# Patient Record
Sex: Male | Born: 1970 | Race: White | Hispanic: No | Marital: Single | Smoking: Never smoker
Health system: Southern US, Community
[De-identification: ages and names within clinical notes are randomized; demographics above are authoritative.]

## PROBLEM LIST (undated history)

## (undated) HISTORY — PX: HEMORRHOID SURGERY: SHX153

---

## 2000-08-05 ENCOUNTER — Encounter: Payer: Self-pay | Admitting: Family Medicine

## 2000-08-05 ENCOUNTER — Ambulatory Visit (HOSPITAL_COMMUNITY): Admission: RE | Admit: 2000-08-05 | Discharge: 2000-08-05 | Payer: Self-pay | Admitting: Family Medicine

## 2000-11-25 ENCOUNTER — Ambulatory Visit (HOSPITAL_COMMUNITY): Admission: RE | Admit: 2000-11-25 | Discharge: 2000-11-25 | Payer: Self-pay | Admitting: Gastroenterology

## 2010-08-29 DIAGNOSIS — E079 Disorder of thyroid, unspecified: Secondary | ICD-10-CM

## 2010-08-29 HISTORY — DX: Disorder of thyroid, unspecified: E07.9

## 2015-06-29 DIAGNOSIS — M069 Rheumatoid arthritis, unspecified: Secondary | ICD-10-CM

## 2015-06-29 HISTORY — DX: Rheumatoid arthritis, unspecified: M06.9

## 2019-11-08 ENCOUNTER — Other Ambulatory Visit: Payer: Self-pay | Admitting: Family Medicine

## 2019-11-08 DIAGNOSIS — E049 Nontoxic goiter, unspecified: Secondary | ICD-10-CM

## 2019-11-15 ENCOUNTER — Ambulatory Visit
Admission: RE | Admit: 2019-11-15 | Discharge: 2019-11-15 | Disposition: A | Discharge status: Home / Self Care | Payer: BC Managed Care – PPO | Source: Ambulatory Visit | Attending: Family Medicine | Admitting: Family Medicine

## 2019-11-15 DIAGNOSIS — E049 Nontoxic goiter, unspecified: Secondary | ICD-10-CM

## 2019-11-17 ENCOUNTER — Other Ambulatory Visit: Payer: Self-pay | Admitting: Family Medicine

## 2019-11-17 DIAGNOSIS — E041 Nontoxic single thyroid nodule: Secondary | ICD-10-CM

## 2019-12-01 ENCOUNTER — Ambulatory Visit
Admission: RE | Admit: 2019-12-01 | Discharge: 2019-12-01 | Disposition: A | Discharge status: Home / Self Care | Payer: BC Managed Care – PPO | Source: Ambulatory Visit | Attending: Family Medicine | Admitting: Family Medicine

## 2019-12-01 ENCOUNTER — Other Ambulatory Visit (HOSPITAL_COMMUNITY)
Admission: RE | Admit: 2019-12-01 | Discharge: 2019-12-01 | Disposition: A | Discharge status: Home / Self Care | Payer: BC Managed Care – PPO | Source: Ambulatory Visit | Attending: Radiology | Admitting: Radiology

## 2019-12-01 DIAGNOSIS — E041 Nontoxic single thyroid nodule: Secondary | ICD-10-CM | POA: Insufficient documentation

## 2019-12-02 LAB — CYTOLOGY - NON PAP

## 2019-12-15 ENCOUNTER — Encounter (HOSPITAL_COMMUNITY): Payer: Self-pay

## 2021-07-27 DIAGNOSIS — R079 Chest pain, unspecified: Secondary | ICD-10-CM | POA: Insufficient documentation

## 2021-08-23 ENCOUNTER — Other Ambulatory Visit: Payer: Self-pay | Admitting: Family Medicine

## 2021-08-24 LAB — CBC WITH DIFFERENTIAL/PLATELET
Basophils Absolute: 0 10*3/uL (ref 0.0–0.2)
Basos: 0 %
EOS (ABSOLUTE): 0.1 10*3/uL (ref 0.0–0.4)
Eos: 1 %
Hematocrit: 40.1 % (ref 37.5–51.0)
Hemoglobin: 12.9 g/dL — ABNORMAL LOW (ref 13.0–17.7)
Immature Grans (Abs): 0 10*3/uL (ref 0.0–0.1)
Immature Granulocytes: 0 %
Lymphocytes Absolute: 1.8 10*3/uL (ref 0.7–3.1)
Lymphs: 26 %
MCH: 29.1 pg (ref 26.6–33.0)
MCHC: 32.2 g/dL (ref 31.5–35.7)
MCV: 90 fL (ref 79–97)
Monocytes Absolute: 0.5 10*3/uL (ref 0.1–0.9)
Monocytes: 8 %
Neutrophils Absolute: 4.3 10*3/uL (ref 1.4–7.0)
Neutrophils: 65 %
Platelets: 219 10*3/uL (ref 150–450)
RBC: 4.44 x10E6/uL (ref 4.14–5.80)
RDW: 14 % (ref 11.6–15.4)
WBC: 6.7 10*3/uL (ref 3.4–10.8)

## 2021-08-24 LAB — COMPREHENSIVE METABOLIC PANEL
ALT: 14 IU/L (ref 0–44)
AST: 12 IU/L (ref 0–40)
Albumin/Globulin Ratio: 2 (ref 1.2–2.2)
Albumin: 4.5 g/dL (ref 3.8–4.9)
Alkaline Phosphatase: 85 IU/L (ref 44–121)
BUN/Creatinine Ratio: 11 (ref 9–20)
BUN: 11 mg/dL (ref 6–24)
Bilirubin Total: 0.5 mg/dL (ref 0.0–1.2)
CO2: 24 mmol/L (ref 20–29)
Calcium: 9.1 mg/dL (ref 8.7–10.2)
Chloride: 108 mmol/L — ABNORMAL HIGH (ref 96–106)
Creatinine, Ser: 0.96 mg/dL (ref 0.76–1.27)
Globulin, Total: 2.3 g/dL (ref 1.5–4.5)
Glucose: 118 mg/dL — ABNORMAL HIGH (ref 70–99)
Potassium: 4.3 mmol/L (ref 3.5–5.2)
Sodium: 146 mmol/L — ABNORMAL HIGH (ref 134–144)
Total Protein: 6.8 g/dL (ref 6.0–8.5)
eGFR: 96 mL/min/{1.73_m2} (ref >59)

## 2021-08-24 LAB — RPR QUALITATIVE: RPR Ser Ql: NONREACTIVE

## 2021-08-24 LAB — LIPID PANEL W/O CHOL/HDL RATIO
Cholesterol, Total: 168 mg/dL (ref 100–199)
HDL: 40 mg/dL (ref >39)
LDL Chol Calc (NIH): 109 mg/dL — ABNORMAL HIGH (ref 0–99)
Triglycerides: 105 mg/dL (ref 0–149)
VLDL Cholesterol Cal: 19 mg/dL (ref 5–40)

## 2021-08-24 LAB — RHEUMATOID FACTOR: Rheumatoid fact SerPl-aCnc: <10 IU/mL (ref <14.0)

## 2021-08-24 LAB — PSA: Prostate Specific Ag, Serum: 3.8 ng/mL (ref 0.0–4.0)

## 2021-08-24 LAB — HGB A1C W/O EAG: Hgb A1c MFr Bld: 6 % — ABNORMAL HIGH (ref 4.8–5.6)

## 2021-08-24 LAB — TSH: TSH: 0.847 u[IU]/mL (ref 0.450–4.500)

## 2021-08-24 LAB — T4, FREE: Free T4: 0.95 ng/dL (ref 0.82–1.77)

## 2021-08-31 ENCOUNTER — Encounter: Payer: Self-pay | Admitting: Internal Medicine

## 2021-08-31 ENCOUNTER — Other Ambulatory Visit: Payer: Self-pay

## 2021-08-31 ENCOUNTER — Ambulatory Visit: Payer: Self-pay | Admitting: Internal Medicine

## 2021-08-31 VITALS — BP 147/80 | HR 70 | Temp 97.6°F | Ht 69.0 in | Wt 168.0 lb

## 2021-08-31 DIAGNOSIS — K582 Mixed irritable bowel syndrome: Secondary | ICD-10-CM

## 2021-08-31 DIAGNOSIS — K589 Irritable bowel syndrome without diarrhea: Secondary | ICD-10-CM | POA: Insufficient documentation

## 2021-08-31 DIAGNOSIS — K219 Gastro-esophageal reflux disease without esophagitis: Secondary | ICD-10-CM

## 2021-08-31 DIAGNOSIS — R7303 Prediabetes: Secondary | ICD-10-CM

## 2021-08-31 MED ORDER — HYOSCYAMINE SULFATE 0.125 MG PO TABS: 0.1250 mg | ORAL_TABLET | Freq: Four times a day (QID) | ORAL | Status: DC | PRN

## 2021-08-31 NOTE — Progress Notes (Signed)
? ?New Patient Office Visit ? ?Subjective:  ?Patient ID: Brandon Petty, male    DOB: 11/22/1970  Age: 51 y.o. MRN: [id number] ? ?CC:  ?Chief Complaint  ?Patient presents with  ? Chest Pain  ?  Bottom left side of his chest. Radiating pain to his middle back. Some relief when he lies down  ? ? ?HPI ?Brandon Petty presents for episodes of left chest pain associated with meals that radiates to his back. Pain was 8-9/10 severity and would last for 30 minutes with spontaneous relief unrelated to exertion. No associated SOB or palpitations. Admits to h/o abddominal bloating and diarrhea. Last episode was 4 days ago no known relieving factors. Also reports exacerbation of abd symptoms with wheat products. ? ?Past Medical History:  ?Diagnosis Date  ? Rheumatoid arthritis (HCC) 06/29/2015  ? Thyroid condition 08/29/2010  ? Not entirley sure  ?Anal fissure ? ?Past Surgical History:  ?Procedure Laterality Date  ? HEMORRHOID SURGERY    ? ? ?Family History  ?Problem Relation Age of Onset  ? Hyperlipidemia Mother   ? Heart failure Father   ? ? ?Social History  ? ?Socioeconomic History  ? Marital status: Single  ?  Spouse name: Not on file  ? Number of children: Not on file  ? Years of education: Not on file  ? Highest education level: Doctorate  ?Occupational History  ? Not on file  ?Tobacco Use  ? Smoking status: Never  ? Smokeless tobacco: Never  ?Vaping Use  ? Vaping Use: Not on file  ?Substance and Sexual Activity  ? Alcohol use: Never  ? Drug use: Not Currently  ? Sexual activity: Not Currently  ?Other Topics Concern  ? Not on file  ?Social History Narrative  ? Not on file  ? ?Social Determinants of Health  ? ?Financial Resource Strain: Not on file  ?Food Insecurity: Not on file  ?Transportation Needs: Not on file  ?Physical Activity: Not on file  ?Stress: Not on file  ?Social Connections: Not on file  ?Intimate Partner Violence: Not on file  ? ? ?ROS ?Review of Systems  ?Constitutional: Negative.   ?HENT: Negative.     ?Respiratory: Negative.    ?Cardiovascular:   ?     As in hpi  ?Gastrointestinal:  Positive for abdominal distention, constipation and diarrhea.  ?Endocrine: Negative.   ?Genitourinary: Negative.   ?Musculoskeletal: Negative.   ?Skin: Negative.   ?Psychiatric/Behavioral: Negative.    ? ?Objective:  ? ?Today'Kris Burd Vitals: BP (!) 147/80 (BP Location: Left Arm, Patient Position: Sitting)   Pulse 70   Temp 97.6 ?F (36.4 ?C)   Ht 5\' 9"  (1.753 m)   Wt 168 lb (76.2 kg)   SpO2 98%   BMI 24.81 kg/m?  ? ?Physical Exam ?Vitals reviewed.  ?Constitutional:   ?   Appearance: He is well-developed.  ?Eyes:  ?   Pupils: Pupils are equal, round, and reactive to light.  ?Neck:  ?   Thyroid: Thyroid mass present.  ?Cardiovascular:  ?   Rate and Rhythm: Normal rate and regular rhythm.  ?   Heart sounds: Normal heart sounds. No murmur heard. ?Pulmonary:  ?   Effort: No respiratory distress.  ?   Breath sounds: No rhonchi or rales.  ?Abdominal:  ?   General: Abdomen is flat.  ?   Tenderness: There is abdominal tenderness in the left upper quadrant and left lower quadrant. There is no guarding or rebound.  ?Musculoskeletal:     ?   General:  Normal range of motion.  ?   Cervical back: Neck supple.  ?Skin: ?   General: Skin is warm and dry.  ?Neurological:  ?   General: No focal deficit present.  ?   Mental Status: He is alert and oriented to person, place, and time.  ? ? ?Assessment & Plan:  ?Atypical CP-treat for GERD, if persists refer for cardiac w/u ?IBS-order hyoscyamine and enquire for Viberzi samples ?Problem List Items Addressed This Visit   ? ?  ? Digestive  ? IBS (irritable bowel syndrome)  ?  ? Other  ? Pre-diabetes  ? ? ?No outpatient encounter medications on file as of 08/31/2021.  ? ?No facility-administered encounter medications on file as of 08/31/2021.  ? ? ?Follow-up: No follow-ups on file.  ? ?Luna Fuse, MD ? ?

## 2021-09-28 ENCOUNTER — Ambulatory Visit: Payer: Self-pay | Admitting: Cardiovascular Disease

## 2021-09-28 ENCOUNTER — Encounter: Payer: Self-pay | Admitting: Cardiovascular Disease

## 2021-09-28 VITALS — BP 134/73 | HR 73 | Temp 98.2°F | Ht 69.0 in | Wt 172.2 lb

## 2021-09-28 DIAGNOSIS — K582 Mixed irritable bowel syndrome: Secondary | ICD-10-CM

## 2021-09-28 MED ORDER — HYOSCYAMINE SULFATE 0.125 MG PO TABS: 0.1250 mg | ORAL_TABLET | ORAL | 3 refills | Status: DC | PRN

## 2021-09-28 NOTE — Progress Notes (Signed)
? ?Established Patient Office Visit ? ?Subjective:  ?Patient ID: Brandon Petty, male    DOB: 11-26-1970  Age: 51 y.o. MRN: [id number] ? ?CC:  ?Chief Complaint  ?Patient presents with  ? Follow-up  ?  Follow-up for meds and chest pain  ? ? ?HPI ?Brandon Petty presents for a follow-up visit regarding chest pain suspected to be due to GERD and IBS.  He was seen last month for chest pain, abdominal bloating and diarrhea.  He was given hyoscyamine and omeprazole.  He was only able to get omeprazole and took it for 1 month with only slight improvement in symptoms.  It gave him diarrhea.  He has not started taking histamine.  He has no exertional chest pain.  All his symptoms seem to be triggered by certain foods and with hunger.  This has been a chronic problem for him for more than 20 years. ? ?I reviewed his EKG from last month which was normal.  In addition, he complains of left hip and back discomfort. ? ?Past Medical History:  ?Diagnosis Date  ? Rheumatoid arthritis ([place]) 06/29/2015  ? Thyroid condition 08/29/2010  ? Not entirley sure  ? ? ?Past Surgical History:  ?Procedure Laterality Date  ? HEMORRHOID SURGERY    ? ? ?Family History  ?Problem Relation Age of Onset  ? Hyperlipidemia Mother   ? Heart failure Father   ? ? ?Social History  ? ?Socioeconomic History  ? Marital status: Single  ?  Spouse name: Not on file  ? Number of children: Not on file  ? Years of education: Not on file  ? Highest education level: Doctorate  ?Occupational History  ? Not on file  ?Tobacco Use  ? Smoking status: Never  ? Smokeless tobacco: Never  ?Vaping Use  ? Vaping Use: Not on file  ?Substance and Sexual Activity  ? Alcohol use: Never  ? Drug use: Not Currently  ? Sexual activity: Not Currently  ?Other Topics Concern  ? Not on file  ?Social History Narrative  ? Not on file  ? ?Social Determinants of Health  ? ?Financial Resource Strain: Not on file  ?Food Insecurity: Not on file  ?Transportation Needs: Not on file  ?Physical  Activity: Not on file  ?Stress: Not on file  ?Social Connections: Not on file  ?Intimate Partner Violence: Not on file  ? ? ?Outpatient Medications Prior to Visit  ?Medication Sig Dispense Refill  ? omeprazole (PRILOSEC) 40 MG capsule Take 40 mg by mouth daily.    ? hyoscyamine (LEVSIN) tablet 0.125 mg     ? ?No facility-administered medications prior to visit.  ? ? ?Not on File ? ?ROS ?Review of Systems ? ?  ?Objective:  ?  ?Physical Exam ? ?BP 134/73   Pulse 73   Temp 98.2 ?F (36.8 ?C) (Oral)   Ht 5' 9"  (1.753 m)   Wt 172 lb 3.2 oz (78.1 kg)   SpO2 97%   BMI 25.43 kg/m?  ?Wt Readings from Last 3 Encounters:  ?09/28/21 172 lb 3.2 oz (78.1 kg)  ?08/31/21 168 lb (76.2 kg)  ? ? ? ?Health Maintenance Due  ?Topic Date Due  ? HIV Screening  Never done  ? Hepatitis C Screening  Never done  ? TETANUS/TDAP  Never done  ? COLONOSCOPY (Pts 45-40yr Insurance coverage will need to be confirmed)  Never done  ? Zoster Vaccines- Shingrix (1 of 2) Never done  ? ? ?There are no preventive care reminders to display for this  patient. ? ?Lab Results  ?Component Value Date  ? TSH 0.847 08/23/2021  ? ?Lab Results  ?Component Value Date  ? WBC 6.7 08/23/2021  ? HGB 12.9 (L) 08/23/2021  ? HCT 40.1 08/23/2021  ? MCV 90 08/23/2021  ? PLT 219 08/23/2021  ? ?Lab Results  ?Component Value Date  ? NA 146 (H) 08/23/2021  ? K 4.3 08/23/2021  ? CO2 24 08/23/2021  ? GLUCOSE 118 (H) 08/23/2021  ? BUN 11 08/23/2021  ? CREATININE 0.96 08/23/2021  ? BILITOT 0.5 08/23/2021  ? ALKPHOS 85 08/23/2021  ? AST 12 08/23/2021  ? ALT 14 08/23/2021  ? PROT 6.8 08/23/2021  ? ALBUMIN 4.5 08/23/2021  ? CALCIUM 9.1 08/23/2021  ? EGFR 96 08/23/2021  ? ?Lab Results  ?Component Value Date  ? CHOL 168 08/23/2021  ? ?Lab Results  ?Component Value Date  ? HDL 40 08/23/2021  ? ?Lab Results  ?Component Value Date  ? LDLCALC 109 (H) 08/23/2021  ? ?Lab Results  ?Component Value Date  ? TRIG 105 08/23/2021  ? ?No results found for: CHOLHDL ?Lab Results  ?Component Value  Date  ? HGBA1C 6.0 (H) 08/23/2021  ? ? ?  ?Assessment & Plan:  ? ?1.  Irritable bowel syndrome: No significant improvement with omeprazole and it seems that he had diarrhea with it.  I re prescribed Hyoscyamine to be used as needed.  A prescription was sent to [facility]. ?Given his age, I will need to be referred for colonoscopy in the near future. ? ?2.  Left hip and back pain: Seems to be a chronic issue.  Schedule with Dr. Donivan Scull. ? ?Meds ordered this encounter  ?Medications  ? hyoscyamine (LEVSIN) 0.125 MG tablet  ?  Sig: Take 1 tablet (0.125 mg total) by mouth every 4 (four) hours as needed.  ?  Dispense:  30 tablet  ?  Refill:  3  ? ? ?Follow-up: in 3 months ? ? ?Kathlyn Sacramento, MD ?

## 2021-11-30 ENCOUNTER — Ambulatory Visit: Payer: Self-pay | Admitting: Orthopedic Surgery

## 2021-11-30 VITALS — BP 138/73 | HR 74 | Temp 98.1°F | Ht 69.5 in | Wt 171.8 lb

## 2021-11-30 DIAGNOSIS — G8929 Other chronic pain: Secondary | ICD-10-CM

## 2021-11-30 NOTE — Progress Notes (Signed)
S: LBP, no radiation O: -ve SLR      Normal neuro exam A; mechanical LBP P: LBP,  Diclofanac sod 75 BID

## 2021-12-28 ENCOUNTER — Ambulatory Visit: Payer: Self-pay | Admitting: Orthopedic Surgery

## 2021-12-28 ENCOUNTER — Ambulatory Visit: Payer: Self-pay | Admitting: Cardiovascular Disease

## 2022-02-16 IMAGING — US US THYROID
1 series · 13 of 25 positions shown · non-contrast
Comparison: None.

CLINICAL DATA: Goiter.

EXAM:
THYROID ULTRASOUND
TECHNIQUE: Ultrasound examination of the thyroid gland and adjacent soft
tissues was performed.

[Series 1: us thyroid · 0.10mm/px · 13 of 45 slices shown]
[im 1/45]
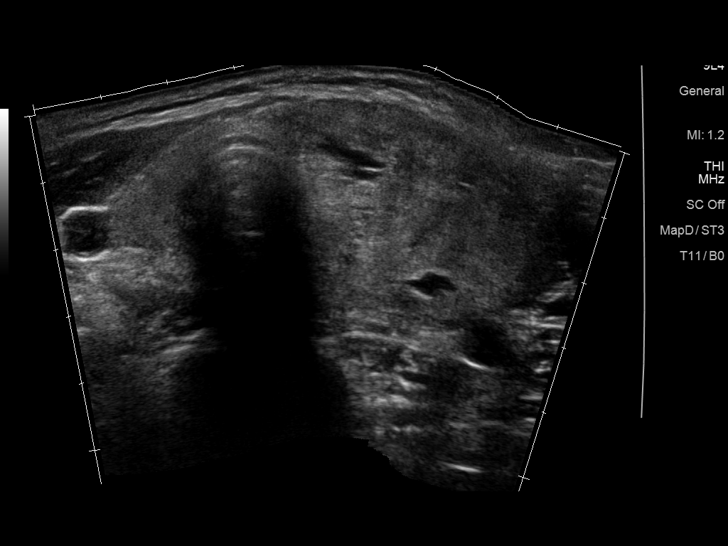
[im 4/45]
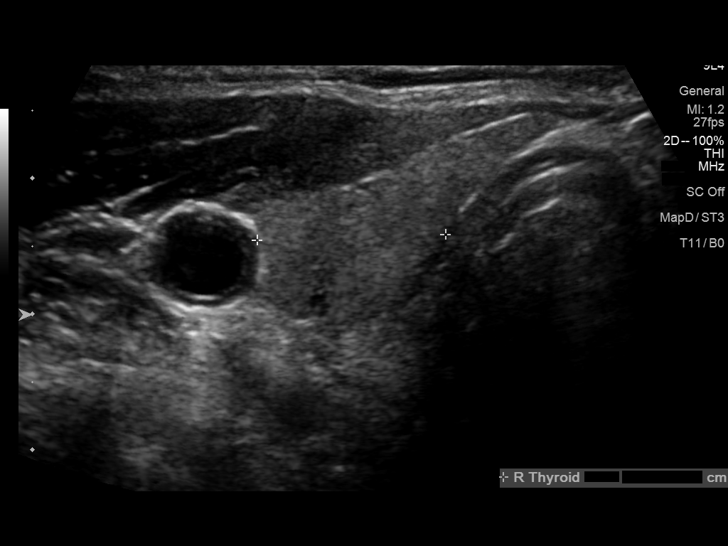
[im 8/45]
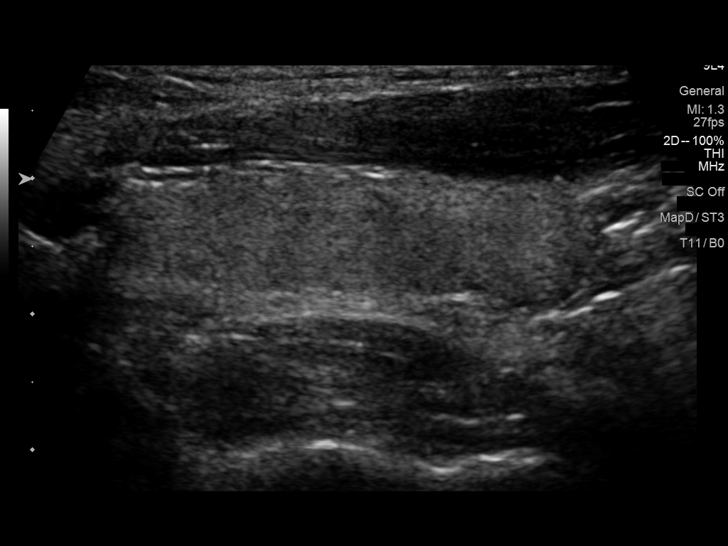
[im 12/45]
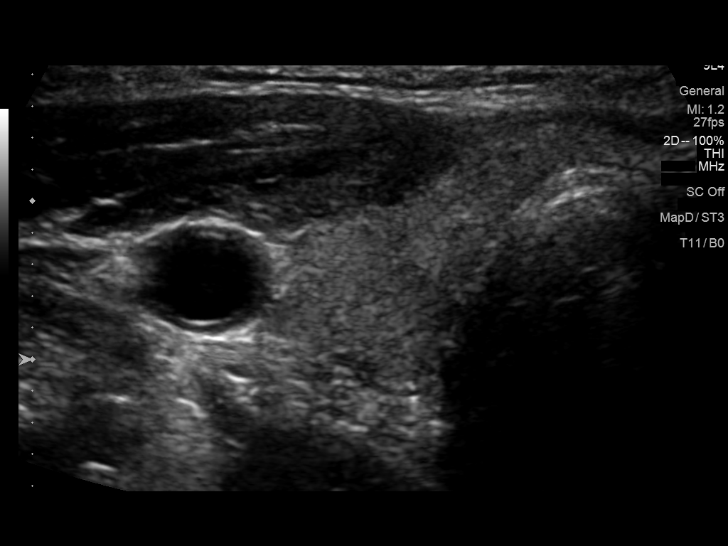
[im 15/45]
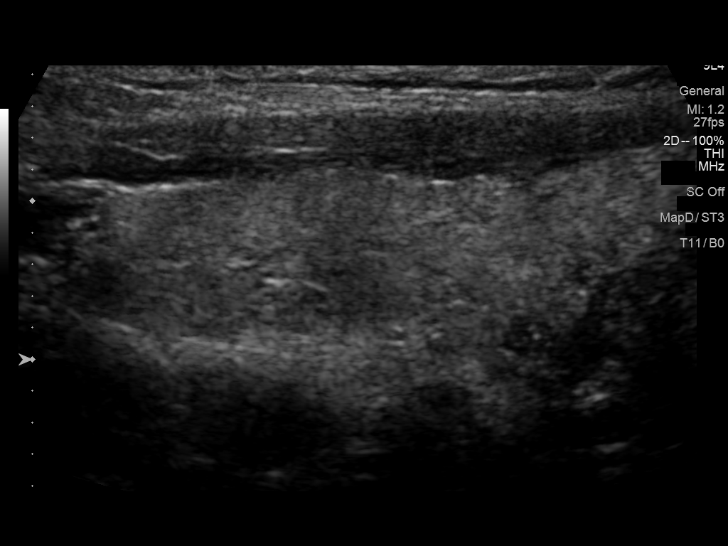
[im 19/45]
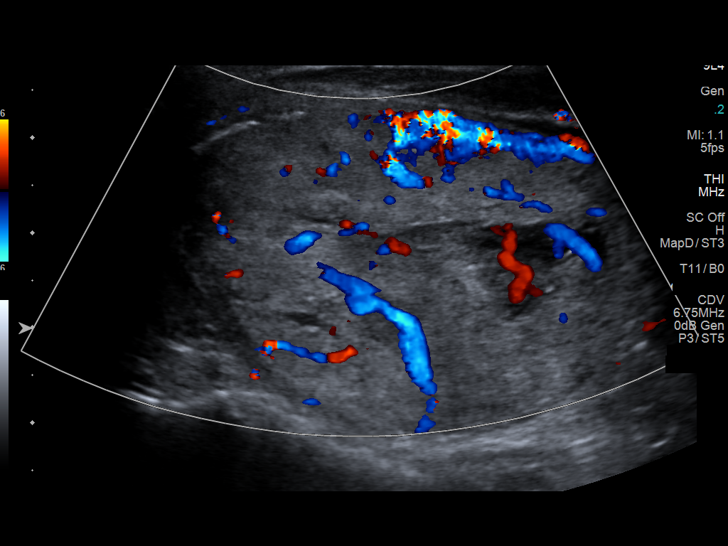
[im 23/45]
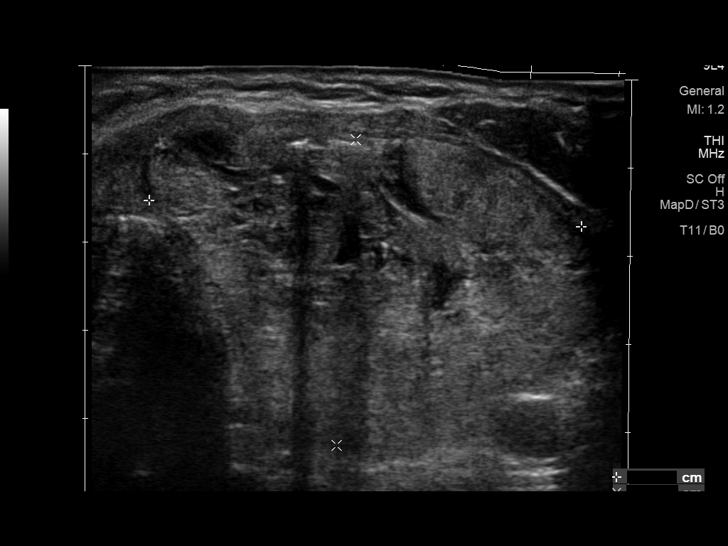
[im 26/45]
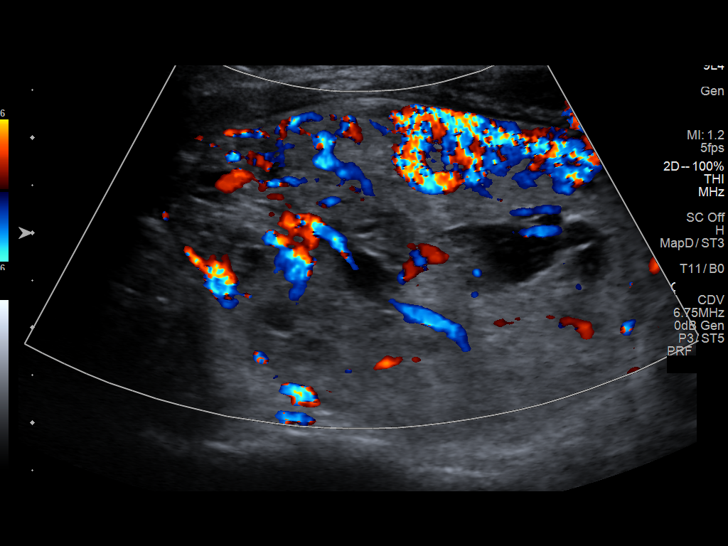
[im 30/45]
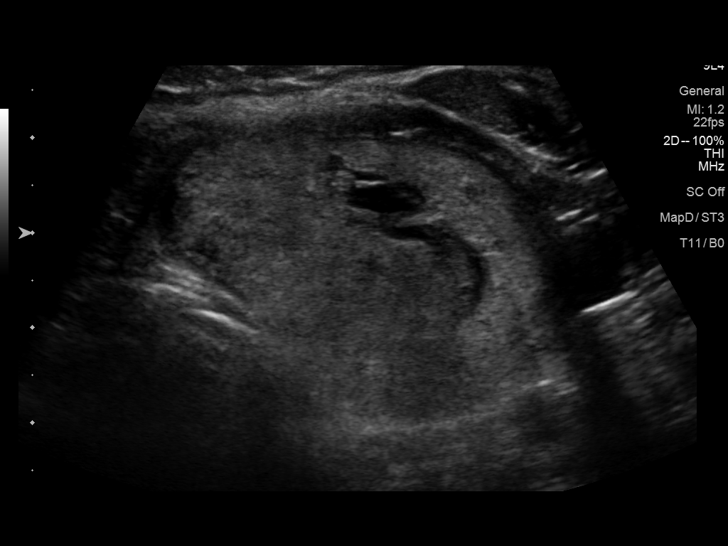
[im 34/45]
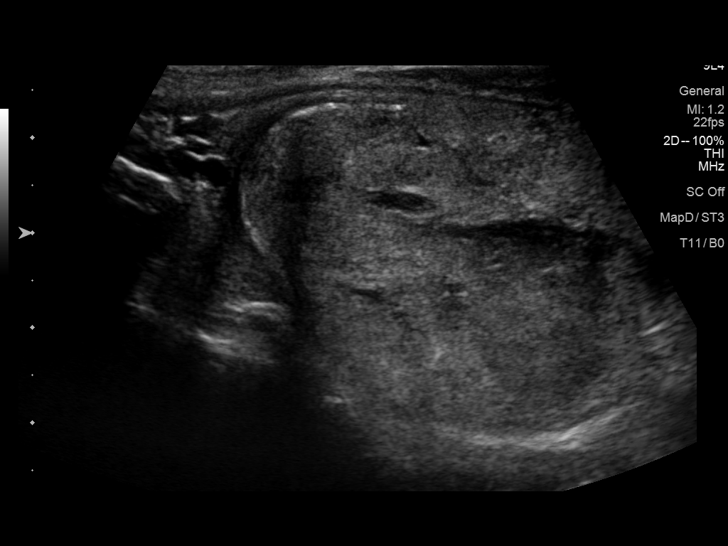
[im 37/45]
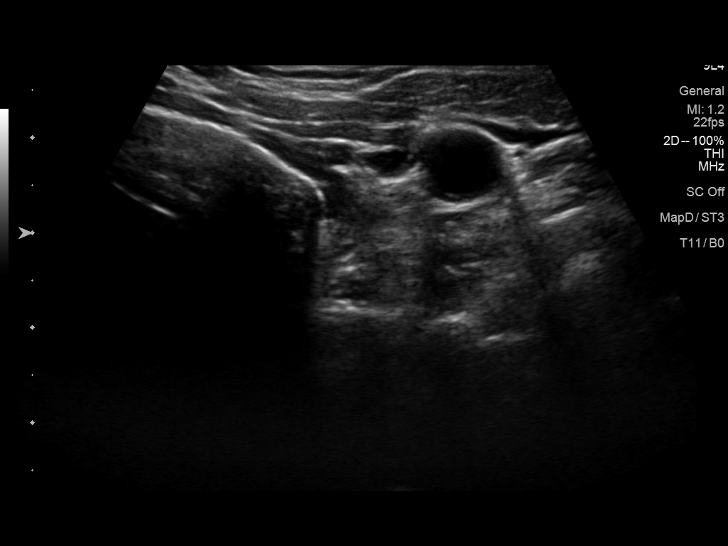
[im 41/45]
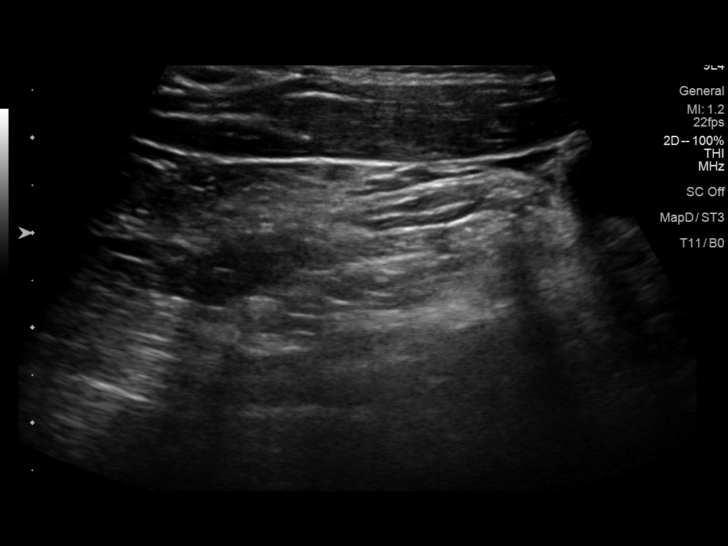
[im 45/45]
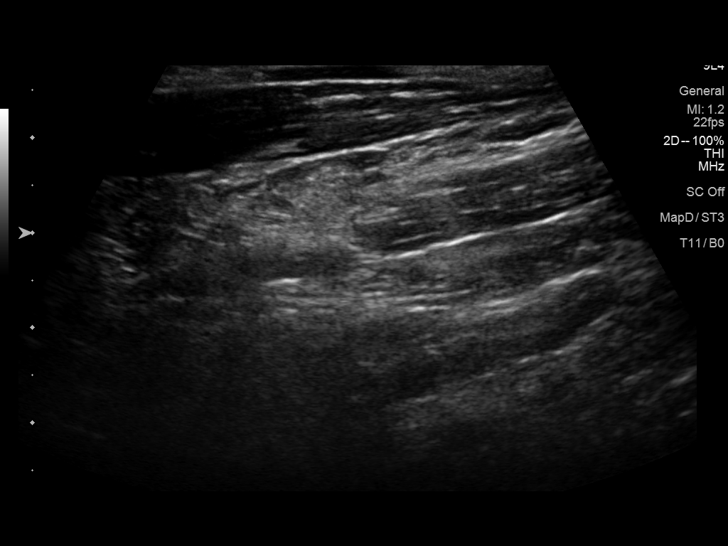

[13 of 25 positions shown; findings below may reference images not displayed]

FINDINGS: Parenchymal Echotexture: Mildly heterogenous

Isthmus: 0.4 cm thickness

Right lobe: 3.8 x 0.9 x 1.4 cm

Left lobe: 5.4 x 3.7 x 4.6 cm

_________________________________________________________

Estimated total number of nodules >/= 1 cm: 1

Number of spongiform nodules >/=  2 cm not described below (TR1): 0

Number of mixed cystic and solid nodules >/= 1.5 cm not described
below (TR2): 0

_________________________________________________________

Nodule # 1:

Location: Left; Mid

Maximum size: 5.4 cm; Other 2 dimensions: 4.9 x 3.5 cm

Composition: solid/almost completely solid (2)

Echogenicity: isoechoic (1)

Shape: not taller-than-wide (0)

Margins: smooth (0)

Echogenic foci: none (0)

ACR TI-RADS total points: 3.

ACR TI-RADS risk category: TR3 (3 points).

ACR TI-RADS recommendations:

**Given size (>/= 2.5 cm) and appearance, fine needle aspiration of
this mildly suspicious nodule should be considered based on TI-RADS
criteria.
IMPRESSION: 1. Asymmetric thyromegaly secondary to solitary 5.4 cm mid left
mildly suspicious nodule. Recommend FNA biopsy.

The above is in keeping with the ACR TI-RADS recommendations - [HOSPITAL] 7307;[DATE].

## 2022-03-04 IMAGING — US US FNA BIOPSY THYROID 1ST LESION
1 series · 13 of 17 positions shown · non-contrast
Comparison: Ultrasound 11/15/2019

MEDICATIONS:
1% lidocaine, 10 ml

COMPLICATIONS:
None immediate.

INDICATION: Indeterminate thyroid nodule.  Left mid

EXAM:
ULTRASOUND GUIDED FINE NEEDLE ASPIRATION OF INDETERMINATE LEFT MID
THYROID NODULE
TECHNIQUE: Informed written consent was obtained from the patient after a
discussion of the risks, benefits and alternatives to treatment.
Questions regarding the procedure were encouraged and answered. A
timeout was performed prior to the initiation of the procedure.

[Series 1: us fna biopsy thyroid 1st lesion · 0.08mm/px · 17 acquisitions, 13 frames shown]
[im 1/17]
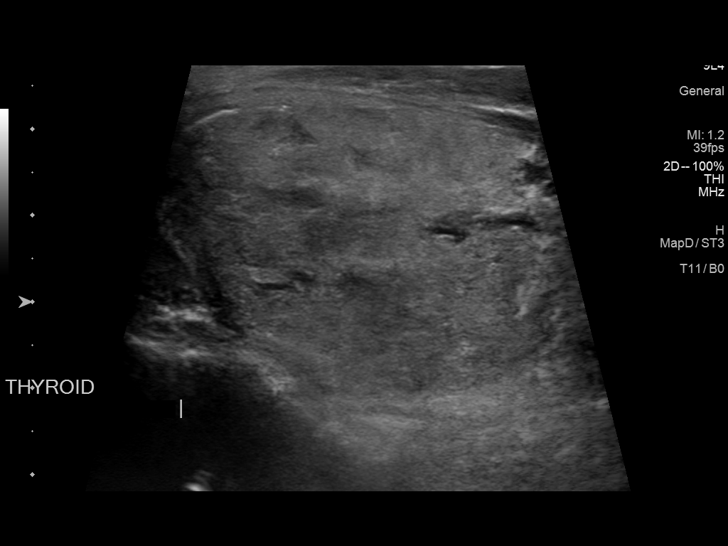
[im 2/17]
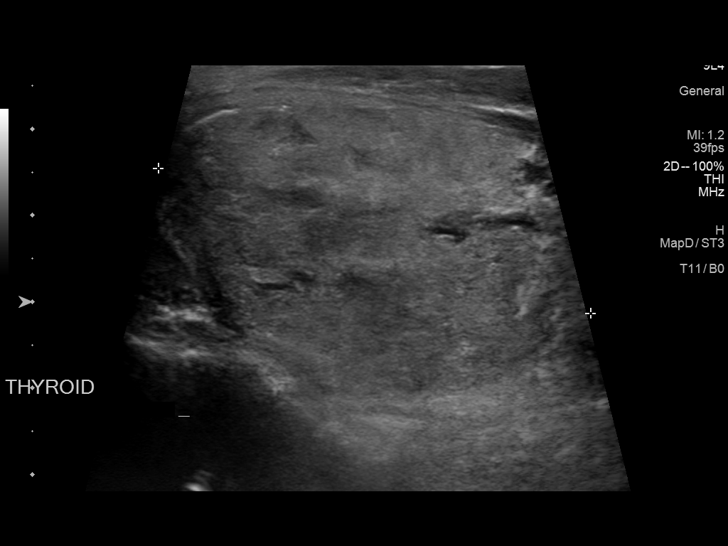
[im 4/17]
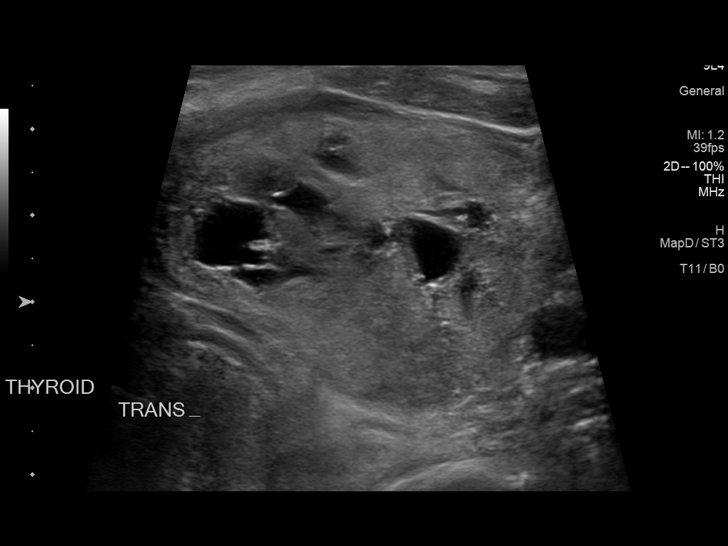
[im 5/17]
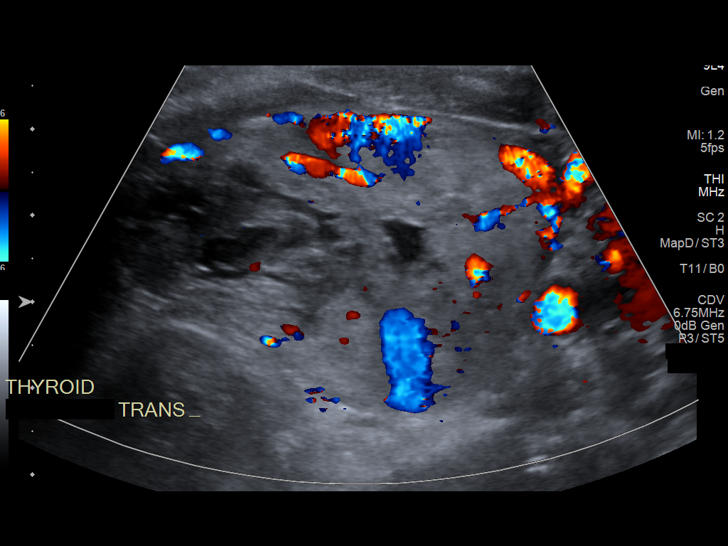
[im 6/17]
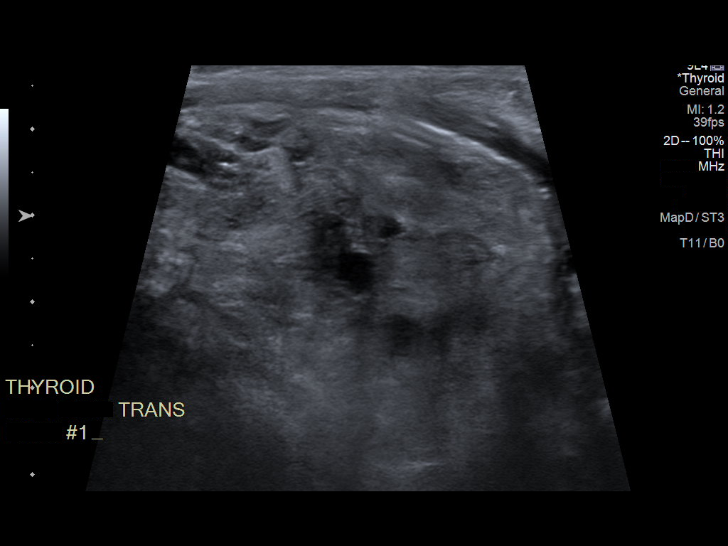
[im 8/17]
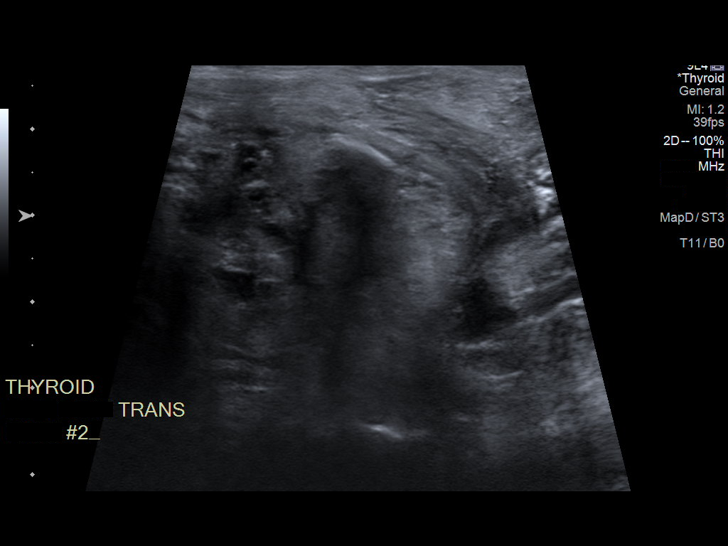
[im 9/17]
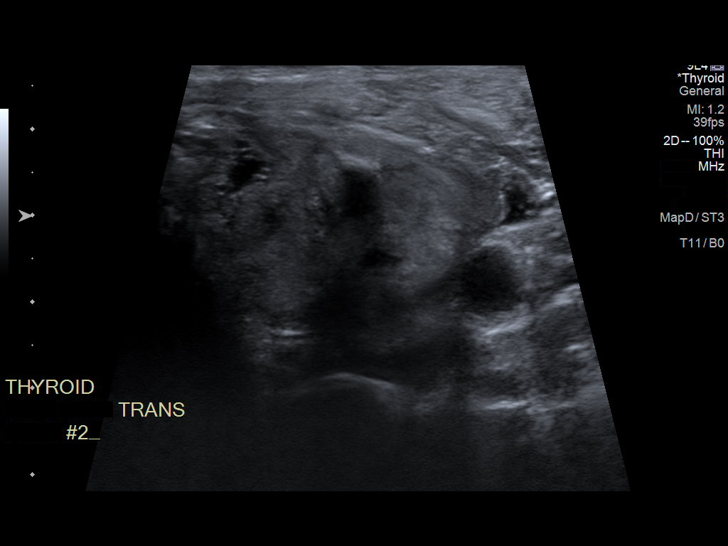
[im 10/17]
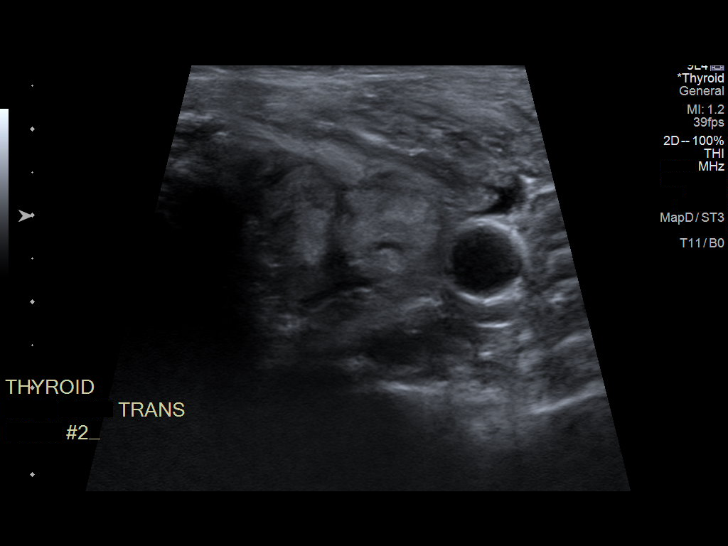
[im 12/17]
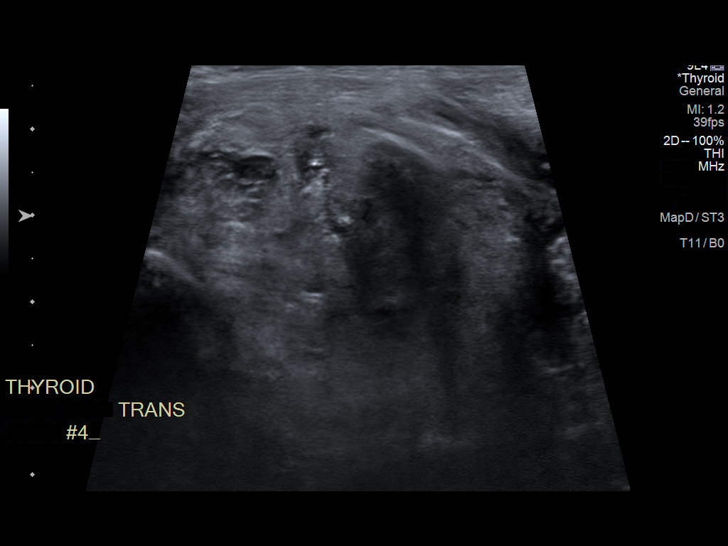
[im 13/17]
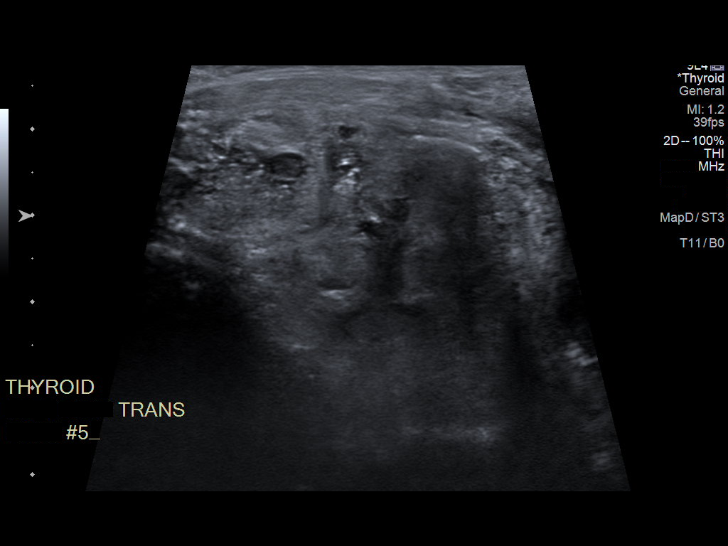
[im 14/17]
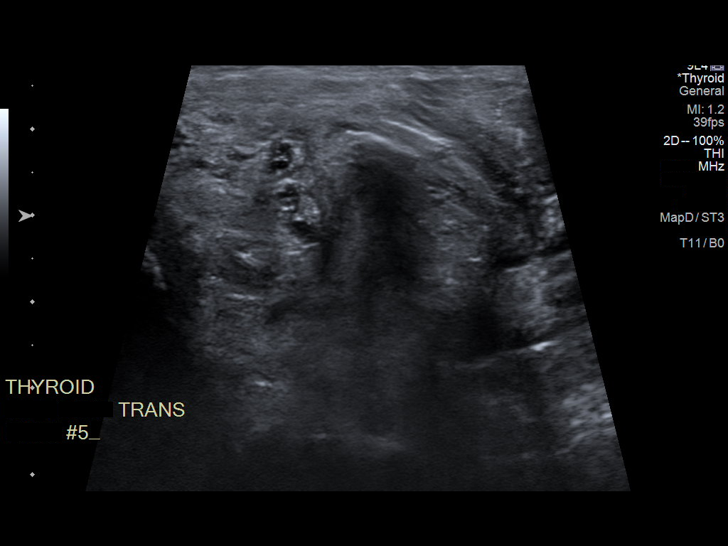
[im 16/17]
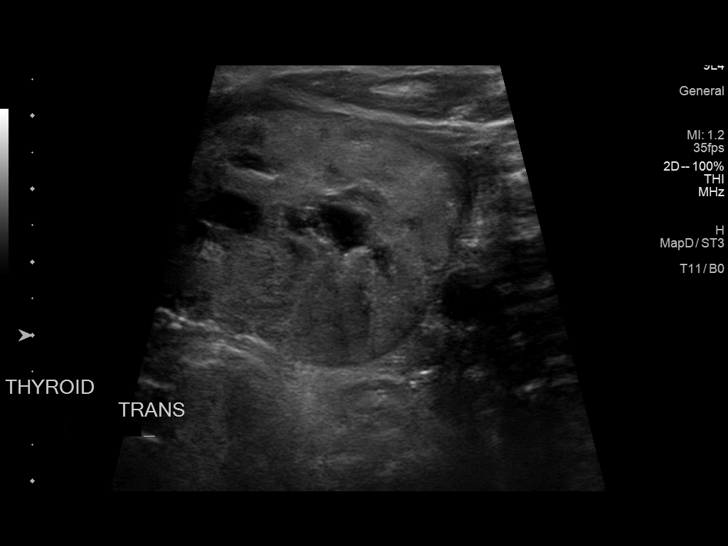
[im 17/17]
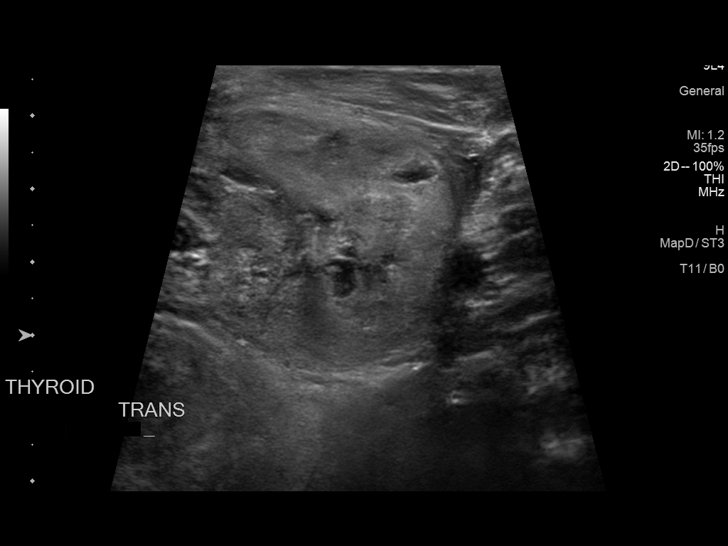

[13 of 17 positions shown; findings below may reference images not displayed]

Pre-procedural ultrasound scanning demonstrated unchanged size and
appearance of the indeterminate nodule within the left mid thyroid

The procedure was planned. The neck was prepped in the usual sterile
fashion, and a sterile drape was applied covering the operative
field. A timeout was performed prior to the initiation of the
procedure. Local anesthesia was provided with 1% lidocaine.

Under direct ultrasound guidance, 5 FNA biopsies were performed of
the left mid nodule with a 25 gauge needle. Multiple ultrasound
images were saved for procedural documentation purposes. The samples
were prepared and submitted to pathology. 2 of these samples were
prepared for Afirma testing.

Limited post procedural scanning was negative for hematoma or
additional complication. Dressings were placed. The patient
tolerated the above procedures procedure well without immediate
postprocedural complication.
FINDINGS: Nodule reference number based on prior diagnostic ultrasound: 1

Maximum size: 5.4 cm

Location: Left; Mid

ACR TI-RADS risk category: TR3 (3 points)

Reason for biopsy: meets ACR TI-RADS criteria

Ultrasound imaging confirms appropriate placement of the needles
within the thyroid nodule.
IMPRESSION: Technically successful ultrasound guided fine needle aspiration of
left mid thyroid nodule.

Read by: Buskie Jagter, NP

## 2022-08-27 DIAGNOSIS — Z125 Encounter for screening for malignant neoplasm of prostate: Secondary | ICD-10-CM | POA: Diagnosis not present

## 2022-08-27 DIAGNOSIS — M25512 Pain in left shoulder: Secondary | ICD-10-CM | POA: Diagnosis not present

## 2022-08-27 DIAGNOSIS — Z0000 Encounter for general adult medical examination without abnormal findings: Secondary | ICD-10-CM | POA: Diagnosis not present

## 2022-08-27 DIAGNOSIS — Z13220 Encounter for screening for lipoid disorders: Secondary | ICD-10-CM | POA: Diagnosis not present

## 2022-08-27 DIAGNOSIS — R7303 Prediabetes: Secondary | ICD-10-CM | POA: Diagnosis not present

## 2022-09-04 ENCOUNTER — Other Ambulatory Visit: Payer: Self-pay | Admitting: Family Medicine

## 2022-09-04 ENCOUNTER — Ambulatory Visit
Admission: RE | Admit: 2022-09-04 | Discharge: 2022-09-04 | Disposition: A | Discharge status: Home / Self Care | Payer: BC Managed Care – PPO | Source: Ambulatory Visit | Attending: Family Medicine | Admitting: Family Medicine

## 2022-09-04 DIAGNOSIS — K219 Gastro-esophageal reflux disease without esophagitis: Secondary | ICD-10-CM | POA: Diagnosis not present

## 2022-09-04 DIAGNOSIS — M25512 Pain in left shoulder: Secondary | ICD-10-CM

## 2022-09-04 DIAGNOSIS — K589 Irritable bowel syndrome without diarrhea: Secondary | ICD-10-CM | POA: Diagnosis not present

## 2022-10-12 ENCOUNTER — Emergency Department (HOSPITAL_COMMUNITY)
Admission: EM | Admit: 2022-10-12 | Discharge: 2022-10-12 | Disposition: A | Discharge status: Home / Self Care | Payer: BC Managed Care – PPO | Attending: Emergency Medicine | Admitting: Emergency Medicine

## 2022-10-12 ENCOUNTER — Encounter (HOSPITAL_COMMUNITY): Payer: Self-pay

## 2022-10-12 ENCOUNTER — Emergency Department (HOSPITAL_COMMUNITY): Payer: BC Managed Care – PPO

## 2022-10-12 DIAGNOSIS — R58 Hemorrhage, not elsewhere classified: Secondary | ICD-10-CM | POA: Diagnosis not present

## 2022-10-12 DIAGNOSIS — S0101XA Laceration without foreign body of scalp, initial encounter: Secondary | ICD-10-CM

## 2022-10-12 DIAGNOSIS — S0990XA Unspecified injury of head, initial encounter: Secondary | ICD-10-CM | POA: Diagnosis not present

## 2022-10-12 MED ORDER — LIDOCAINE-EPINEPHRINE (PF) 2 %-1:200000 IJ SOLN
10.0000 mL | Freq: Once | INTRAMUSCULAR | Status: DC
  Filled 2022-10-12: qty 20

## 2022-10-12 MED ORDER — OXYCODONE-ACETAMINOPHEN 5-325 MG PO TABS
1.0000 | ORAL_TABLET | Freq: Once | ORAL | Status: AC
  Administered 2022-10-12: 1 via ORAL
  Filled 2022-10-12: qty 1
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
## 2022-10-12 NOTE — Discharge Instructions (Signed)
You were seen in the emergency department for head injury/laceration.  CT imaging of your head did not reveal any emergent concerns.  We have closed your laceration(s) with staples. These need to be removed in 7-10 days. This can be done at any doctor's office, urgent care, or emergency department.   If any of the staples come out before it is time for removal, that is okay. Make sure to keep the area as clean and dry as possible. You can let warm soapy warm run over the area, but do NOT scrub it.   Watch out for signs of infection, like we discussed, including: increased redness, tenderness, or drainage of pus from the area. If this happens and you have not been prescribed an antibiotic, please seek medical attention for possible infection.   You can take over the counter pain medicine like ibuprofen or tylenol as needed.
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
## 2022-10-14 DIAGNOSIS — M545 Low back pain, unspecified: Secondary | ICD-10-CM | POA: Diagnosis not present

## 2022-10-14 DIAGNOSIS — D171 Benign lipomatous neoplasm of skin and subcutaneous tissue of trunk: Secondary | ICD-10-CM | POA: Diagnosis not present

## 2022-10-14 DIAGNOSIS — S0191XD Laceration without foreign body of unspecified part of head, subsequent encounter: Secondary | ICD-10-CM | POA: Diagnosis not present

## 2022-10-20 DIAGNOSIS — S0191XD Laceration without foreign body of unspecified part of head, subsequent encounter: Secondary | ICD-10-CM | POA: Diagnosis not present

## 2022-10-20 DIAGNOSIS — H1013 Acute atopic conjunctivitis, bilateral: Secondary | ICD-10-CM | POA: Diagnosis not present

## 2022-10-28 DIAGNOSIS — D120 Benign neoplasm of cecum: Secondary | ICD-10-CM | POA: Diagnosis not present

## 2022-10-28 DIAGNOSIS — K648 Other hemorrhoids: Secondary | ICD-10-CM | POA: Diagnosis not present

## 2022-10-28 DIAGNOSIS — Z1211 Encounter for screening for malignant neoplasm of colon: Secondary | ICD-10-CM | POA: Diagnosis not present

## 2022-10-28 DIAGNOSIS — D123 Benign neoplasm of transverse colon: Secondary | ICD-10-CM | POA: Diagnosis not present

## 2022-10-28 DIAGNOSIS — D122 Benign neoplasm of ascending colon: Secondary | ICD-10-CM | POA: Diagnosis not present
